# Patient Record
Sex: Male | Born: 1980 | Race: White | Hispanic: No | Marital: Single | Smoking: Current every day smoker
Health system: Southern US, Community
[De-identification: ages and names within clinical notes are randomized; demographics above are authoritative.]

## PROBLEM LIST (undated history)

## (undated) DIAGNOSIS — I10 Essential (primary) hypertension: Secondary | ICD-10-CM

---

## 2003-06-01 ENCOUNTER — Emergency Department (HOSPITAL_COMMUNITY): Admission: AC | Admit: 2003-06-01 | Discharge: 2003-06-01 | Payer: Self-pay

## 2003-06-04 ENCOUNTER — Ambulatory Visit (HOSPITAL_COMMUNITY): Admission: RE | Admit: 2003-06-04 | Discharge: 2003-06-04 | Payer: Self-pay | Admitting: Orthopedic Surgery

## 2003-06-08 ENCOUNTER — Encounter: Admission: RE | Admit: 2003-06-08 | Discharge: 2003-09-06 | Payer: Self-pay | Admitting: Orthopedic Surgery

## 2005-05-06 ENCOUNTER — Emergency Department (HOSPITAL_COMMUNITY): Admission: AD | Admit: 2005-05-06 | Discharge: 2005-05-06 | Payer: Self-pay | Admitting: Emergency Medicine
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
## 2022-03-16 DIAGNOSIS — F10939 Alcohol use, unspecified with withdrawal, unspecified: Secondary | ICD-10-CM | POA: Insufficient documentation

## 2022-03-16 DIAGNOSIS — Z720 Tobacco use: Secondary | ICD-10-CM | POA: Diagnosis present

## 2022-03-16 DIAGNOSIS — I159 Secondary hypertension, unspecified: Secondary | ICD-10-CM | POA: Insufficient documentation

## 2022-03-16 DIAGNOSIS — R112 Nausea with vomiting, unspecified: Secondary | ICD-10-CM | POA: Insufficient documentation

## 2022-03-16 DIAGNOSIS — K852 Alcohol induced acute pancreatitis without necrosis or infection: Secondary | ICD-10-CM | POA: Insufficient documentation

## 2022-03-16 DIAGNOSIS — K219 Gastro-esophageal reflux disease without esophagitis: Secondary | ICD-10-CM | POA: Diagnosis present

## 2022-03-16 DIAGNOSIS — F419 Anxiety disorder, unspecified: Secondary | ICD-10-CM | POA: Diagnosis present

## 2022-03-16 DIAGNOSIS — F1010 Alcohol abuse, uncomplicated: Secondary | ICD-10-CM | POA: Diagnosis present

## 2022-05-07 ENCOUNTER — Ambulatory Visit (HOSPITAL_COMMUNITY): Payer: Medicaid Other | Admitting: Mental Health

## 2022-05-07 ENCOUNTER — Telehealth (HOSPITAL_COMMUNITY): Payer: Self-pay | Admitting: Mental Health

## 2022-05-07 NOTE — Telephone Encounter (Signed)
Therapist sent link for tele-therapy session. No response after 10 minutes. Therapist contacted number on file with message ' number can not be completed as dialed.'  NS

## 2023-05-21 NOTE — Telephone Encounter (Signed)
 Per Dr. Debora, called pt and updated that K si 2.4.  Recommend pt present to ED for K repletion and monitoring given risk of cardiac arrhythmia.  Pt expressed understanding and in agreement with plan.

## 2023-05-23 DIAGNOSIS — K863 Pseudocyst of pancreas: Secondary | ICD-10-CM | POA: Insufficient documentation

## 2023-07-18 ENCOUNTER — Encounter (HOSPITAL_COMMUNITY): Payer: Self-pay

## 2023-07-18 ENCOUNTER — Emergency Department (HOSPITAL_COMMUNITY)

## 2023-07-18 ENCOUNTER — Observation Stay (HOSPITAL_COMMUNITY)
Admission: EM | Admit: 2023-07-18 | Discharge: 2023-07-19 | Disposition: A | Discharge status: Home / Self Care | Attending: Internal Medicine | Admitting: Internal Medicine

## 2023-07-18 ENCOUNTER — Other Ambulatory Visit: Payer: Self-pay

## 2023-07-18 DIAGNOSIS — K7010 Alcoholic hepatitis without ascites: Secondary | ICD-10-CM | POA: Diagnosis not present

## 2023-07-18 DIAGNOSIS — R519 Headache, unspecified: Secondary | ICD-10-CM | POA: Insufficient documentation

## 2023-07-18 DIAGNOSIS — K219 Gastro-esophageal reflux disease without esophagitis: Secondary | ICD-10-CM | POA: Diagnosis present

## 2023-07-18 DIAGNOSIS — I10 Essential (primary) hypertension: Secondary | ICD-10-CM | POA: Diagnosis not present

## 2023-07-18 DIAGNOSIS — F1010 Alcohol abuse, uncomplicated: Secondary | ICD-10-CM | POA: Diagnosis present

## 2023-07-18 DIAGNOSIS — E876 Hypokalemia: Secondary | ICD-10-CM | POA: Diagnosis not present

## 2023-07-18 DIAGNOSIS — M62838 Other muscle spasm: Secondary | ICD-10-CM | POA: Diagnosis present

## 2023-07-18 DIAGNOSIS — E871 Hypo-osmolality and hyponatremia: Secondary | ICD-10-CM

## 2023-07-18 DIAGNOSIS — Z720 Tobacco use: Secondary | ICD-10-CM | POA: Diagnosis not present

## 2023-07-18 DIAGNOSIS — F419 Anxiety disorder, unspecified: Secondary | ICD-10-CM | POA: Diagnosis present

## 2023-07-18 HISTORY — DX: Essential (primary) hypertension: I10

## 2023-07-18 LAB — BASIC METABOLIC PANEL WITH GFR
Anion gap: 15 (ref 5–15)
Anion gap: 10 (ref 5–15)
BUN: 17 mg/dL (ref 6–20)
BUN: 13 mg/dL (ref 6–20)
CO2: 22 mmol/L (ref 22–32)
CO2: 25 mmol/L (ref 22–32)
Calcium: 7 mg/dL — ABNORMAL LOW (ref 8.9–10.3)
Calcium: 7.2 mg/dL — ABNORMAL LOW (ref 8.9–10.3)
Chloride: 97 mmol/L — ABNORMAL LOW (ref 98–111)
Chloride: 99 mmol/L (ref 98–111)
Creatinine, Ser: 0.79 mg/dL (ref 0.61–1.24)
Creatinine, Ser: 0.91 mg/dL (ref 0.61–1.24)
GFR, Estimated: >60 mL/min (ref >60)
GFR, Estimated: >60 mL/min (ref >60)
Glucose, Bld: 121 mg/dL — ABNORMAL HIGH (ref 70–99)
Glucose, Bld: 137 mg/dL — ABNORMAL HIGH (ref 70–99)
Potassium: 2.4 mmol/L — CL (ref 3.5–5.1)
Potassium: 3.2 mmol/L — ABNORMAL LOW (ref 3.5–5.1)
Sodium: 134 mmol/L — ABNORMAL LOW (ref 135–145)
Sodium: 134 mmol/L — ABNORMAL LOW (ref 135–145)

## 2023-07-18 LAB — CBC WITH DIFFERENTIAL/PLATELET
Abs Immature Granulocytes: 0.09 K/uL — ABNORMAL HIGH (ref 0.00–0.07)
Basophils Absolute: 0.1 K/uL (ref 0.0–0.1)
Basophils Relative: 1 %
Eosinophils Absolute: 0.1 K/uL (ref 0.0–0.5)
Eosinophils Relative: 1 %
HCT: 34.9 % — ABNORMAL LOW (ref 39.0–52.0)
Hemoglobin: 12.6 g/dL — ABNORMAL LOW (ref 13.0–17.0)
Immature Granulocytes: 1 %
Lymphocytes Relative: 5 %
Lymphs Abs: 0.5 K/uL — ABNORMAL LOW (ref 0.7–4.0)
MCH: 37.8 pg — ABNORMAL HIGH (ref 26.0–34.0)
MCHC: 36.1 g/dL — ABNORMAL HIGH (ref 30.0–36.0)
MCV: 104.8 fL — ABNORMAL HIGH (ref 80.0–100.0)
Monocytes Absolute: 0.3 K/uL (ref 0.1–1.0)
Monocytes Relative: 3 %
Neutro Abs: 9 K/uL — ABNORMAL HIGH (ref 1.7–7.7)
Neutrophils Relative %: 89 %
Platelets: 123 K/uL — ABNORMAL LOW (ref 150–400)
RBC: 3.33 MIL/uL — ABNORMAL LOW (ref 4.22–5.81)
RDW: 13.9 % (ref 11.5–15.5)
WBC: 10 K/uL (ref 4.0–10.5)
nRBC: 0 % (ref 0.0–0.2)

## 2023-07-18 LAB — HEPATIC FUNCTION PANEL
ALT: 120 U/L — ABNORMAL HIGH (ref 0–44)
AST: 205 U/L — ABNORMAL HIGH (ref 15–41)
Albumin: 3.4 g/dL — ABNORMAL LOW (ref 3.5–5.0)
Alkaline Phosphatase: 109 U/L (ref 38–126)
Bilirubin, Direct: 0.7 mg/dL — ABNORMAL HIGH (ref 0.0–0.2)
Indirect Bilirubin: 1.9 mg/dL — ABNORMAL HIGH (ref 0.3–0.9)
Total Bilirubin: 2.6 mg/dL — ABNORMAL HIGH (ref 0.0–1.2)
Total Protein: 5.6 g/dL — ABNORMAL LOW (ref 6.5–8.1)

## 2023-07-18 LAB — VITAMIN D 25 HYDROXY (VIT D DEFICIENCY, FRACTURES): Vit D, 25-Hydroxy: 9.41 ng/mL — ABNORMAL LOW (ref 30–100)

## 2023-07-18 LAB — PROTIME-INR
INR: 1 (ref 0.8–1.2)
Prothrombin Time: 13.3 s (ref 11.4–15.2)

## 2023-07-18 LAB — PHOSPHORUS: Phosphorus: 1.7 mg/dL — ABNORMAL LOW (ref 2.5–4.6)

## 2023-07-18 LAB — MAGNESIUM
Magnesium: 0.7 mg/dL — CL (ref 1.7–2.4)
Magnesium: 1.3 mg/dL — ABNORMAL LOW (ref 1.7–2.4)

## 2023-07-18 LAB — ETHANOL: Alcohol, Ethyl (B): <15 mg/dL (ref <15)

## 2023-07-18 MED ORDER — MAGNESIUM SULFATE 50 % IJ SOLN: 2.0000 g | Freq: Once | INTRAMUSCULAR | Status: DC

## 2023-07-18 MED ORDER — PANTOPRAZOLE SODIUM 40 MG PO TBEC
40.0000 mg | DELAYED_RELEASE_TABLET | Freq: Two times a day (BID) | ORAL | Status: DC
Start: 2023-07-18 — End: 2023-07-19
  Administered 2023-07-18: 40 mg via ORAL
  Filled 2023-07-18: qty 1

## 2023-07-18 MED ORDER — FOLIC ACID 1 MG PO TABS
1.0000 mg | ORAL_TABLET | Freq: Every day | ORAL | Status: DC
  Administered 2023-07-18: 1 mg via ORAL
  Filled 2023-07-18: qty 1

## 2023-07-18 MED ORDER — TRAMADOL HCL 50 MG PO TABS
50.0000 mg | ORAL_TABLET | Freq: Three times a day (TID) | ORAL | Status: DC | PRN
  Administered 2023-07-18 – 2023-07-19 (×2): 50 mg via ORAL
  Filled 2023-07-18 (×2): qty 1

## 2023-07-18 MED ORDER — MAGNESIUM SULFATE 2 GM/50ML IV SOLN
2.0000 g | Freq: Once | INTRAVENOUS | Status: AC
  Administered 2023-07-18: 2 g via INTRAVENOUS
  Filled 2023-07-18: qty 50

## 2023-07-18 MED ORDER — THIAMINE MONONITRATE 100 MG PO TABS
100.0000 mg | ORAL_TABLET | Freq: Every day | ORAL | Status: DC
  Administered 2023-07-18: 100 mg via ORAL
  Filled 2023-07-18: qty 1

## 2023-07-18 MED ORDER — LOSARTAN POTASSIUM 50 MG PO TABS
50.0000 mg | ORAL_TABLET | Freq: Every day | ORAL | Status: DC
  Administered 2023-07-18: 50 mg via ORAL
  Filled 2023-07-18: qty 1

## 2023-07-18 MED ORDER — POTASSIUM CHLORIDE 20 MEQ PO PACK
60.0000 meq | PACK | Freq: Once | ORAL | Status: AC
  Administered 2023-07-18: 60 meq via ORAL
  Filled 2023-07-18: qty 3

## 2023-07-18 MED ORDER — LORAZEPAM 2 MG/ML IJ SOLN
1.0000 mg | INTRAMUSCULAR | Status: DC | PRN
  Administered 2023-07-19: 2 mg via INTRAVENOUS
  Filled 2023-07-18: qty 1

## 2023-07-18 MED ORDER — POTASSIUM CHLORIDE CRYS ER 20 MEQ PO TBCR
40.0000 meq | EXTENDED_RELEASE_TABLET | Freq: Once | ORAL | Status: AC
  Administered 2023-07-18: 40 meq via ORAL
  Filled 2023-07-18: qty 2

## 2023-07-18 MED ORDER — ESCITALOPRAM OXALATE 10 MG PO TABS: 10.0000 mg | ORAL_TABLET | Freq: Every day | ORAL | Status: DC

## 2023-07-18 MED ORDER — THIAMINE HCL 100 MG/ML IJ SOLN: 100.0000 mg | Freq: Every day | INTRAMUSCULAR | Status: DC

## 2023-07-18 MED ORDER — LORAZEPAM 1 MG PO TABS: 0.0000 mg | ORAL_TABLET | Freq: Two times a day (BID) | ORAL | Status: DC

## 2023-07-18 MED ORDER — LORAZEPAM 1 MG PO TABS
1.0000 mg | ORAL_TABLET | ORAL | Status: DC | PRN
  Administered 2023-07-19: 1 mg via ORAL
  Filled 2023-07-18: qty 1

## 2023-07-18 MED ORDER — NICOTINE 14 MG/24HR TD PT24
14.0000 mg | MEDICATED_PATCH | Freq: Every day | TRANSDERMAL | Status: DC
  Administered 2023-07-18: 14 mg via TRANSDERMAL
  Filled 2023-07-18: qty 1

## 2023-07-18 MED ORDER — POTASSIUM CHLORIDE 10 MEQ/100ML IV SOLN
10.0000 meq | INTRAVENOUS | Status: AC
  Administered 2023-07-18 (×3): 10 meq via INTRAVENOUS
  Filled 2023-07-18 (×3): qty 100

## 2023-07-18 MED ORDER — ONDANSETRON HCL 4 MG/2ML IJ SOLN: 4.0000 mg | Freq: Four times a day (QID) | INTRAMUSCULAR | Status: DC | PRN

## 2023-07-18 MED ORDER — K PHOS MONO-SOD PHOS DI & MONO 155-852-130 MG PO TABS
500.0000 mg | ORAL_TABLET | Freq: Four times a day (QID) | ORAL | Status: DC
  Administered 2023-07-18 (×3): 500 mg via ORAL
  Filled 2023-07-18 (×4): qty 2

## 2023-07-18 MED ORDER — SODIUM CHLORIDE 0.9 % IV BOLUS
1000.0000 mL | Freq: Once | INTRAVENOUS | Status: AC
  Administered 2023-07-18: 1000 mL via INTRAVENOUS

## 2023-07-18 MED ORDER — ADULT MULTIVITAMIN W/MINERALS CH
1.0000 | ORAL_TABLET | Freq: Every day | ORAL | Status: DC
  Administered 2023-07-18: 1 via ORAL
  Filled 2023-07-18: qty 1

## 2023-07-18 MED ORDER — ONDANSETRON HCL 4 MG PO TABS: 4.0000 mg | ORAL_TABLET | Freq: Four times a day (QID) | ORAL | Status: DC | PRN

## 2023-07-18 MED ORDER — LORAZEPAM 1 MG PO TABS
0.0000 mg | ORAL_TABLET | Freq: Four times a day (QID) | ORAL | Status: DC
  Administered 2023-07-18: 1 mg via ORAL
  Filled 2023-07-18: qty 2

## 2023-07-18 MED ORDER — MAGNESIUM SULFATE 2 GM/50ML IV SOLN
2.0000 g | INTRAVENOUS | Status: AC
  Administered 2023-07-18: 2 g via INTRAVENOUS
  Filled 2023-07-18: qty 50

## 2023-07-18 MED ORDER — MELATONIN 5 MG PO TABS
5.0000 mg | ORAL_TABLET | Freq: Every evening | ORAL | Status: DC | PRN
  Administered 2023-07-18: 5 mg via ORAL
  Filled 2023-07-18: qty 1

## 2023-07-18 MED ORDER — CALCIUM GLUCONATE-NACL 1-0.675 GM/50ML-% IV SOLN
1.0000 g | Freq: Once | INTRAVENOUS | Status: AC
  Administered 2023-07-18: 1000 mg via INTRAVENOUS
  Filled 2023-07-18: qty 50

## 2023-07-18 NOTE — ED Triage Notes (Signed)
 Pt BIBA from work for muscle spasms/contractions. Was working outside, felt dizzy and nauseous, went inside to cool off and then whole body started contracting. 2.5mg  versed twice by ems, legs and left arm mostly relaxed. 18ga LF. 1000cc NS  118/82 Hr 78 Cbg 100 98% RA

## 2023-07-18 NOTE — H&P (Signed)
 History and Physical    Patient: Craig Houston FMW:[id number] DOB: 04/25/1980 DOA: 07/18/2023 DOS: the patient was seen and examined on 07/18/2023 PCP: Patient, No Pcp Per  Patient coming from: Home  Chief Complaint:  Chief Complaint  Patient presents with   Spasms   HPI: Craig Houston is a 43 y.o. male with medical history significant of alcohol abuse, alcohol withdrawal syndrome, alcoholic pancreatitis, pancreatic pseudocyst, anxiety, depression, GERD, hypertension, tobacco abuse who was brought to the emergency department due to severe muscle spasms.  He still drinks alcohol every night. He denied fever, chills, rhinorrhea, sore throat, wheezing or hemoptysis.  No chest pain, palpitations, diaphoresis, PND, orthopnea or pitting edema of the lower extremities.  Occasional epigastric abdominal pain and diarrhea, but no nausea, emesis, constipation, melena or hematochezia.  No flank pain, dysuria, frequency or hematuria.  No polyuria, polydipsia, polyphagia or blurred vision.   Lab work: CBC showed white count 10.0, mono 12.6 g/dL MCV 895.1 fL and platelets are 123.  Alcohol less than 15, magnesium  0.7 and possible 1.7 mg/dL.  BMP showed sodium 134, potassium 2.4, chloride 97 and CO2 23 mmol/L.  Normal anion gap.  Glucose 121 and corrected calcium  7.6 mg/dL.  Renal function was normal.  LFTs showed a total protein of 5.6 and albumin 3.4 g/dL.  AST 205, ALT 120 and alkaline phosphatase 109 units/L.  Total bilirubin is 2.6 and direct bilirubin 0.7 mg/dL.  Imaging: CT head without contrast with no acute intracranial process.   ED course: Initial vital signs were temperature 98.9 F, pulse 84, respiration 13, BP 107/96 mmHg O2 sat 99% on room air.  The patient received magnesium  sulfate 2 g IVPB, calcium  gluconate 1 g IVPB, KCl 60 mEq p.o., KCl 10 mEq IVPB x 2.  And 1000 mL of NS bolus.  Review of Systems: As mentioned in the history of present illness. All other systems reviewed and are  negative. Past Medical History:  Diagnosis Date   Acute alcoholic pancreatitis 03/16/2022   Alcohol abuse 03/16/2022   Alcohol withdrawal syndrome (HCC) 03/16/2022   Anxiety and depression 03/16/2022   Gastroesophageal reflux disease without esophagitis 03/16/2022   Hypertension    Pancreatic pseudocyst 05/23/2023   Secondary hypertension 03/16/2022   Tobacco abuse 03/16/2022   History reviewed. No pertinent surgical history. Social History:  reports that he has been smoking cigarettes. He has never used smokeless tobacco. He reports current alcohol use. He reports that he does not use drugs.  Not on File  Family History  Problem Relation Age of Onset   Other Neg Hx     Prior to Admission medications   Not on File    Physical Exam: Vitals:   07/18/23 0855 07/18/23 0856 07/18/23 0859  BP: (!) 107/96    Pulse: 78    Resp: 13    Temp:  98.9 F (37.2 C)   TempSrc:  Oral   SpO2: 99%    Weight:   65.8 kg  Height:   5' 11 (1.803 m)   Physical Exam Vitals reviewed.  Constitutional:      General: He is awake. He is not in acute distress.    Appearance: He is normal weight. He is ill-appearing.  HENT:     Head: Normocephalic.     Nose: No rhinorrhea.     Mouth/Throat:     Mouth: Mucous membranes are moist.  Eyes:     General: Scleral icterus present.     Pupils: Pupils are equal, round,  and reactive to light.  Neck:     Vascular: No JVD.  Cardiovascular:     Rate and Rhythm: Normal rate and regular rhythm.     Heart sounds: S1 normal and S2 normal.  Pulmonary:     Effort: Pulmonary effort is normal.     Breath sounds: No wheezing, rhonchi or rales.  Abdominal:     General: Bowel sounds are normal. There is no distension.     Palpations: Abdomen is soft.     Tenderness: There is no abdominal tenderness.  Musculoskeletal:     Cervical back: Neck supple.     Right lower leg: No edema.     Left lower leg: No edema.  Skin:    General: Skin is warm and dry.   Neurological:     General: No focal deficit present.     Mental Status: He is alert and oriented to person, place, and time.  Psychiatric:        Mood and Affect: Mood normal.        Behavior: Behavior normal. Behavior is cooperative.     Data Reviewed:  Results are pending, will review when available. EKG: Vent. rate 83 BPM PR interval 158 ms QRS duration 99 ms QT/QTcB 398/468 ms P-R-T axes 40 67 33 Sinus rhythm Borderline T abnormalities, anterior leadsc  Assessment and Plan: Principal Problem:   Alcohol abuse Leading to:   Alcoholic hepatitis And   Hypomagnesemia Observation/telemetry. CIWA protocol with lorazepam . Magnesium  sulfate supplementation. Folate, MVI and thiamine . Consult TOC team. Follow-up LFTs. Alcohol cessation advised.  Active Problems:   Hypokalemia Replaced parenterally and orally. Magnesium  was supplemented.    Hypocalcemia Replacing. Follow calcium  and albumin level.    Hypophosphatemia Replacing. Follow-up phosphorus level.    Hypertension  Continue losartan  50 mg p.o. daily.    Anxiety and depression Continue escitalopram  10 mg p.o. daily.    Gastroesophageal reflux disease without esophagitis Continue pantoprazole  40 mg p.o. daily.    Tobacco abuse Tobacco cessation advised. Nicotine  replacement therapy ordered.     Advance Care Planning:   Code Status: Full Code   Consults:   Family Communication:   Severity of Illness: The appropriate patient status for this patient is INPATIENT. Inpatient status is judged to be reasonable and necessary in order to provide the required intensity of service to ensure the patient's safety. The patient's presenting symptoms, physical exam findings, and initial radiographic and laboratory data in the context of their chronic comorbidities is felt to place them at high risk for further clinical deterioration. Furthermore, it is not anticipated that the patient will be medically stable for  discharge from the hospital within 2 midnights of admission.   * I certify that at the point of admission it is my clinical judgment that the patient will require inpatient hospital care spanning beyond 2 midnights from the point of admission due to high intensity of service, high risk for further deterioration and high frequency of surveillance required.*  Author: Alm Dorn Castor, MD 07/18/2023 12:42 PM  For on call review [email or web address].   This document was prepared using Dragon voice recognition software and may contain some unintended transcription errors.

## 2023-07-18 NOTE — Plan of Care (Signed)

## 2023-07-18 NOTE — ED Provider Notes (Signed)
 [facility] AT [facility] Provider Note   CSN: [id number] Arrival date & time: 07/18/23  9153     Patient presents with: Spasms   Craig Houston is a 43 y.o. male with a history of hypertension,  presents with concern for muscle cramping that started earlier today at work.  States this involved both arms and the legs, and then subsided after about half hour.  He reports some associated pain with the cramping.  Denies any loss of consciousness or full body convulsions.  States he works at [employer] where it is very hot, and is unsure if he may be dehydrated. Denies any fever or chills.   He does report drinking about 2-3 beers nightly.  Denies any history of withdrawals.    HPI     Prior to Admission medications   Not on File    Allergies: Patient has no allergy information on record.    Review of Systems  Constitutional:  Negative for chills and fever.    Updated Vital Signs BP (!) 107/96   Pulse 78   Temp 98.9 F (37.2 C) (Oral)   Resp 13   Ht 5' 11 (1.803 m)   Wt 65.8 kg   SpO2 99%   BMI 20.22 kg/m   Physical Exam Vitals and nursing note reviewed.  Constitutional:      General: He is not in acute distress.    Appearance: He is well-developed.     Comments: No tremulousness  HENT:     Head: Normocephalic and atraumatic.     Mouth/Throat:     Mouth: Mucous membranes are dry.  Eyes:     Extraocular Movements: Extraocular movements intact.     Conjunctiva/sclera: Conjunctivae normal.     Pupils: Pupils are equal, round, and reactive to light.  Cardiovascular:     Rate and Rhythm: Normal rate and regular rhythm.     Heart sounds: No murmur heard. Pulmonary:     Effort: Pulmonary effort is normal. No respiratory distress.     Breath sounds: Normal breath sounds.  Abdominal:     Palpations: Abdomen is soft.     Tenderness: There is no abdominal tenderness.  Musculoskeletal:        General: No swelling.     Cervical  back: Neck supple.     Comments: Hands bilaterally with fingers curled in, patient reports difficulty to extend them due to cramping  Skin:    General: Skin is warm and dry.     Capillary Refill: Capillary refill takes less than 2 seconds.  Neurological:     General: No focal deficit present.     Mental Status: He is alert.     Comments: Mental status: Alert and oriented to self, place, and month  Speech: Answers questions appropriately  Cranial Nerves: III, IV, VI: EOM intact, Pupils equal round and reactive, no gaze preference or deviation, no nystagmus. V: normal sensation in V1, V2, and V3 segments bilaterally VII: smiles, puffs cheeks, raises eyebrows, and closes eyes without asymmetry.  VIII: normal hearing to speech IX, X: normal palatal elevation, no uvular deviation XI: 5/5 head turn and 5/5 shoulder shrug bilaterally XII: midline tongue protrusion  Motor: 5/5 strength with resisted elbow flexion and extension bilaterally, ankle plantarflexion and dorsiflexion  Sensory: Intact sensation in upper and lower extremity bilaterally     Psychiatric:        Mood and Affect: Mood normal.     (all labs ordered  are listed, but only abnormal results are displayed) Labs Reviewed  CBC WITH DIFFERENTIAL/PLATELET - Abnormal; Notable for the following components:      Result Value   RBC 3.33 (*)    Hemoglobin 12.6 (*)    HCT 34.9 (*)    MCV 104.8 (*)    MCH 37.8 (*)    MCHC 36.1 (*)    Platelets 123 (*)    Neutro Abs 9.0 (*)    Lymphs Abs 0.5 (*)    Abs Immature Granulocytes 0.09 (*)    All other components within normal limits  BASIC METABOLIC PANEL WITH GFR - Abnormal; Notable for the following components:   Sodium 134 (*)    Potassium 2.4 (*)    Chloride 97 (*)    Glucose, Bld 121 (*)    Calcium  7.0 (*)    All other components within normal limits  MAGNESIUM  - Abnormal; Notable for the following components:   Magnesium  0.7 (*)    All other components within normal  limits  HEPATIC FUNCTION PANEL  PHOSPHORUS    EKG: None  Radiology: CT Head Wo Contrast Result Date: 07/18/2023 CLINICAL DATA:  New onset seizure, dizzy and nausea EXAM: CT HEAD WITHOUT CONTRAST TECHNIQUE: Contiguous axial images were obtained from the base of the skull through the vertex without intravenous contrast. RADIATION DOSE REDUCTION: This exam was performed according to the departmental dose-optimization program which includes automated exposure control, adjustment of the mA and/or kV according to patient size and/or use of iterative reconstruction technique. COMPARISON:  06/01/2003 FINDINGS: Brain: No acute infarct or hemorrhage. Lateral ventricles and midline structures appear unremarkable. No acute extra-axial fluid collections. No mass effect. Vascular: No hyperdense vessel or unexpected calcification. Skull: Normal. Negative for fracture or focal lesion. Sinuses/Orbits: Chronic mucosal thickening within the sphenoid sinus. Remaining paranasal sinuses are clear. Other: None. IMPRESSION: 1. No acute intracranial process. Electronically Signed   By: Ozell Daring M.D.   On: 07/18/2023 09:36     .Critical Care  Performed by: Veta Palma, PA-C Authorized by: Veta Palma, PA-C   Critical care provider statement:    Critical care time (minutes):  32   Critical care was necessary to treat or prevent imminent or life-threatening deterioration of the following conditions:  Metabolic crisis   Critical care was time spent personally by me on the following activities:  Development of treatment plan with patient or surrogate, discussions with consultants, evaluation of patient's response to treatment, examination of patient, ordering and review of laboratory studies, ordering and performing treatments and interventions, pulse oximetry, re-evaluation of patient's condition, review of old charts, obtaining history from patient or surrogate and ordering and review of radiographic  studies   Care discussed with: admitting provider   Comments:     Severe symptomatic hypokalemia, hypocalcemia, hypomagnesemia, hyponatremia    Medications Ordered in the ED  potassium chloride  10 mEq in 100 mL IVPB (10 mEq Intravenous New Bag/Given 07/18/23 1031)  magnesium  sulfate IVPB 2 g 50 mL (has no administration in time range)  nicotine  (NICODERM CQ  - dosed in mg/24 hours) patch 14 mg (has no administration in time range)  sodium chloride  0.9 % bolus 1,000 mL (1,000 mLs Intravenous New Bag/Given 07/18/23 1026)  potassium chloride  (KLOR-CON ) packet 60 mEq (60 mEq Oral Given 07/18/23 1017)  magnesium  sulfate IVPB 2 g 50 mL (2 g Intravenous New Bag/Given 07/18/23 1030)  calcium  gluconate 1 g/ 50 mL sodium chloride  IVPB (1,000 mg Intravenous New Bag/Given 07/18/23 1029)  Medical Decision Making Amount and/or Complexity of Data Reviewed Labs: ordered. Radiology: ordered.  Risk Prescription drug management. Decision regarding hospitalization.     Differential diagnosis includes but is not limited to electrolyte abnormality, dehydration, seizure  ED Course:  Upon initial evaluation, patient is well appearing, no acute distress. Fingers curled on the hands bilaterally which patient reports is from the muscle cramping. No muscle cramping elsewhere currently. Normal neurological exam. Does appear slightly dry. Given report of body shaking, will obtain head CT as part of potential seizure workup. However, think seizure less likely cause of patient symptoms given no full body convulsions or LOC. Questioning heat stroke/dehydration/electrolyte abnormality at this time.   Labs Ordered: I Ordered, and personally interpreted labs.  The pertinent results include:   CBC without leukocytosis.  Hemoglobin low at 12.6 BMP with hypokalemia at 2.4, hyponatremia at 134, hypocalcemia at 7.0 Magnesium  low at 0.7  Imaging Studies ordered: I ordered imaging  studies including CT head  I independently visualized the imaging with scope of interpretation limited to determining acute life threatening conditions related to emergency care. Imaging showed  No acute abnormality I agree with the radiologist interpretation   Cardiac Monitoring: / EKG: The patient was maintained on a cardiac monitor.  I personally viewed and interpreted the cardiac monitored which showed an underlying rhythm of: Normal sinus rhythm     Consultations Obtained: I requested consultation with the hospitalist Dr. Celinda,  and discussed lab and imaging findings as well as pertinent plan - they recommend: Admission for further management of electrolyte abnormalities  Medications Given: NS bolus 60 mEq KCl oral 10 mEq KCl IV x3 2g magnesium  sulfate IV 1g Calcium  gluconate   Upon re-evaluation, patient reports he is feeling better. Cramping in his hands has improved, and he is able to extend out his fingers in the hands fully now.  Low concern for new onset seizure given electrolyte abnormalities would explain muscle cramps today and no full body convulsion or loss of consciousness. Cardiac monitoring without any signs of arrhythmia from his electrolyte abnormalities, will continue to monitor. This seems likely secondary to chronic alcohol use as well as dehydration.     Impression: Hypokalemia Hypocalcemia Hypomagnesemia Hyponatremia  Disposition:  Admission with hospitalist Dr. Celinda.    This chart was dictated using voice recognition software, Dragon. Despite the best efforts of this provider to proofread and correct errors, errors may still occur which can change documentation meaning.       Final diagnoses:  Hypokalemia  Hypomagnesemia  Hyponatremia  Hypocalcemia    ED Discharge Orders     None          Veta Palma, PA-C 07/18/23 1148    Neysa Caron PARAS, [place] 07/18/23 1507

## 2023-07-18 NOTE — TOC Initial Note (Signed)
 Transition of Care [facility]) - Initial/Assessment Note    Patient Details  Name: Craig Houston MRN: [id number] Date of Birth: [birth year]  [facility]) CM/SW Contact:    Sonda Manuella Quill, RN Phone Number: 07/18/2023, 4:03 PM  Clinical Narrative:                 [facility] consult for SA counseling/education; also no PCP listed; spoke w/ pt in room; pt says he lives at home; he plans to return at d/c; pt identified POC sister Craig Houston [phone]); he will arrange his own transportation; pt verified insurance; he says his PCP is Dr Rosalva at [facility]; pt denied SDOH risks; he does not have DME, HH services, or home oxygen; pt declined resources for SA counseling/education; no TOC needs; TOC is signing off; please place consult if needed.  Expected Discharge Plan: Home/Self Care Barriers to Discharge: Continued Medical Work up   Patient Goals and CMS Choice Patient states their goals for this hospitalization and ongoing recovery are:: home          Expected Discharge Plan and Services   Discharge Planning Services: CM Consult   Living arrangements for the past 2 months: Single Family Home                                      Prior Living Arrangements/Services Living arrangements for the past 2 months: Single Family Home Lives with:: Self Patient language and need for interpreter reviewed:: Yes Do you feel safe going back to the place where you live?: Yes      Need for Family Participation in Patient Care: Yes (Comment) Care giver support system in place?: Yes (comment) Current home services:  (n/a) Criminal Activity/Legal Involvement Pertinent to Current Situation/Hospitalization: No - Comment as needed  Activities of Daily Living      Permission Sought/Granted   Permission granted to share information with : Yes, Verbal Permission Granted  Share Information with NAME: Case Manager     Permission granted to share info w Relationship:  Craig Houston (sister) [phone]     Emotional Assessment Appearance:: Appears stated age Attitude/Demeanor/Rapport: Gracious Affect (typically observed): Accepting Orientation: : Oriented to Self, Oriented to Place, Oriented to  Time, Oriented to Situation Alcohol / Substance Use: Not Applicable Psych Involvement: No (comment)  Admission diagnosis:  Hypocalcemia [E83.51] Hypokalemia [E87.6] Hypomagnesemia [E83.42] Hyponatremia [E87.1] Patient Active Problem List   Diagnosis Date Noted   Hypomagnesemia 07/18/2023   Pancreatic pseudocyst 05/23/2023   Anxiety and depression 03/16/2022   Alcohol abuse 03/16/2022   Acute alcoholic pancreatitis 03/16/2022   Alcohol withdrawal syndrome (HCC) 03/16/2022   Gastroesophageal reflux disease without esophagitis 03/16/2022   Secondary hypertension 03/16/2022   Tobacco abuse 03/16/2022   N&V (nausea and vomiting) 03/16/2022   PCP:  Patient, No Pcp Per Pharmacy:   [id number] - [place], [place] - [address] AT [facility] [address] [place] [address] Phone: [phone] Fax: [phone]     Social Drivers of Health (SDOH) Social History: SDOH Screenings   Food Insecurity: No Food Insecurity (07/18/2023)  Housing: Low Risk  (07/18/2023)  Transportation Needs: No Transportation Needs (07/18/2023)  Utilities: Not At Risk (07/18/2023)  Financial Resource Strain: Low Risk  (03/16/2022)   Received from [facility]  Social Connections: Unknown (05/23/2021)   Received from [facility]  Stress: Stress Concern Present (03/16/2022)   Received from [facility]  Tobacco Use: High Risk (07/18/2023)   SDOH Interventions: Food Insecurity Interventions: Intervention Not Indicated, Inpatient TOC Housing Interventions: Intervention Not Indicated, Inpatient TOC Transportation Interventions: Intervention Not Indicated, Inpatient TOC Utilities Interventions: Intervention Not Indicated, Inpatient  TOC   Readmission Risk Interventions     No data to display

## 2023-07-19 LAB — COMPREHENSIVE METABOLIC PANEL WITH GFR
ALT: 89 U/L — ABNORMAL HIGH (ref 0–44)
AST: 131 U/L — ABNORMAL HIGH (ref 15–41)
Albumin: 3.1 g/dL — ABNORMAL LOW (ref 3.5–5.0)
Alkaline Phosphatase: 109 U/L (ref 38–126)
Anion gap: 8 (ref 5–15)
BUN: 9 mg/dL (ref 6–20)
CO2: 26 mmol/L (ref 22–32)
Calcium: 7.1 mg/dL — ABNORMAL LOW (ref 8.9–10.3)
Chloride: 102 mmol/L (ref 98–111)
Creatinine, Ser: 0.6 mg/dL — ABNORMAL LOW (ref 0.61–1.24)
GFR, Estimated: >60 mL/min (ref >60)
Glucose, Bld: 107 mg/dL — ABNORMAL HIGH (ref 70–99)
Potassium: 3.3 mmol/L — ABNORMAL LOW (ref 3.5–5.1)
Sodium: 136 mmol/L (ref 135–145)
Total Bilirubin: 1.2 mg/dL (ref 0.0–1.2)
Total Protein: 5.3 g/dL — ABNORMAL LOW (ref 6.5–8.1)

## 2023-07-19 LAB — CBC
HCT: 32.2 % — ABNORMAL LOW (ref 39.0–52.0)
Hemoglobin: 11.3 g/dL — ABNORMAL LOW (ref 13.0–17.0)
MCH: 37.4 pg — ABNORMAL HIGH (ref 26.0–34.0)
MCHC: 35.1 g/dL (ref 30.0–36.0)
MCV: 106.6 fL — ABNORMAL HIGH (ref 80.0–100.0)
Platelets: 100 K/uL — ABNORMAL LOW (ref 150–400)
RBC: 3.02 MIL/uL — ABNORMAL LOW (ref 4.22–5.81)
RDW: 14 % (ref 11.5–15.5)
WBC: 8.2 K/uL (ref 4.0–10.5)
nRBC: 0 % (ref 0.0–0.2)

## 2023-07-19 LAB — MAGNESIUM: Magnesium: 1.8 mg/dL (ref 1.7–2.4)

## 2023-07-19 LAB — HIV ANTIBODY (ROUTINE TESTING W REFLEX): HIV Screen 4th Generation wRfx: NONREACTIVE

## 2023-07-19 LAB — PHOSPHORUS: Phosphorus: 2.8 mg/dL (ref 2.5–4.6)

## 2023-07-19 MED ORDER — FOLIC ACID 1 MG PO TABS: 1.0000 mg | ORAL_TABLET | Freq: Every day | ORAL | 0 refills | Status: AC

## 2023-07-19 MED ORDER — POTASSIUM CHLORIDE CRYS ER 20 MEQ PO TBCR: 20.0000 meq | EXTENDED_RELEASE_TABLET | Freq: Every day | ORAL | 0 refills | Status: AC

## 2023-07-19 MED ORDER — MAGNESIUM OXIDE -MG SUPPLEMENT 400 (240 MG) MG PO TABS
400.0000 mg | ORAL_TABLET | Freq: Two times a day (BID) | ORAL | 0 refills | Status: AC
Start: 2023-07-19 — End: ?

## 2023-07-19 MED ORDER — POTASSIUM CHLORIDE CRYS ER 20 MEQ PO TBCR: 40.0000 meq | EXTENDED_RELEASE_TABLET | Freq: Once | ORAL | Status: DC

## 2023-07-19 MED ORDER — ADULT MULTIVITAMIN W/MINERALS CH
1.0000 | ORAL_TABLET | Freq: Every day | ORAL | Status: AC
Start: 2023-07-19 — End: ?

## 2023-07-19 MED ORDER — VITAMIN B-1 100 MG PO TABS: 100.0000 mg | ORAL_TABLET | Freq: Every day | ORAL | 0 refills | Status: AC

## 2023-07-19 NOTE — Plan of Care (Signed)
  Problem: Education: Goal: Knowledge of General Education information will improve Description: Including pain rating scale, medication(s)/side effects and non-pharmacologic comfort measures Outcome: Progressing   Problem: Health Behavior/Discharge Planning: Goal: Ability to manage health-related needs will improve Outcome: Progressing   Problem: Clinical Measurements: Goal: Diagnostic test results will improve Outcome: Progressing   Problem: Activity: Goal: Risk for activity intolerance will decrease Outcome: Progressing   Problem: Pain Managment: Goal: General experience of comfort will improve and/or be controlled Outcome: Progressing

## 2023-07-19 NOTE — Discharge Summary (Signed)
 Physician Discharge Summary  Craig Houston FMW:[id number] DOB: 02/27/1980 DOA: 07/18/2023  PCP: Patient, No Pcp Per  Admit date: 07/18/2023 Discharge date: 07/19/2023  Admitted From: Home Disposition: Home  Recommendations for Outpatient Follow-up:  Follow up with PCP in 1 week with repeat CBC/BMP/magnesium  Follow up in ED if symptoms worsen or new appear   Home Health: No Equipment/Devices: None  Discharge Condition: Stable CODE STATUS: Full Diet recommendation: Heart healthy  Brief/Interim Summary: 43 y.o. male with medical history significant of alcohol abuse, alcohol withdrawal syndrome, alcoholic pancreatitis, pancreatic pseudocyst, anxiety, depression, GERD, hypertension, tobacco abuse presented with muscle spasms.  He was found to have magnesium  of 0.7 which was aggressively replaced.  Subsequently, his magnesium  has normalized.  He feels better and feels okay to go home today.  He will be discharged home today.  Outpatient follow-up with PCP  Discharge Diagnoses:   Hypomagnesemia: Improved hypokalemia Hypophosphatemia: Improved Hypocalcemia - Presented with muscle spasms and was found to have electrolyte abnormalities including severe hypomagnesemia.  Magnesium  level has normalized after aggressive repletion.  Will continue oral magnesium  supplementation on discharge.  Potassium is slightly on the lower side: Will replace prior to discharge and continue supplementation on discharge. - Outpatient follow-up of BMP  Alcohol abuse Alcohol hepatitis - Counseled regarding abstinence from alcohol.  No signs of withdrawal.  LFTs improving.  Outpatient follow-up of LFTs - Continue thiamine , multivitamin, folic acid .  Hypertension - Continue losartan .  Outpatient follow-up  GERD - Continue PPI  Tobacco abuse -Tobacco cessation counseling provided by admitting hospitalist   Discharge Instructions  Discharge Instructions     Diet - low sodium heart healthy   Complete  by: As directed    Increase activity slowly   Complete by: As directed       Allergies as of 07/19/2023   No Known Allergies      Medication List     TAKE these medications    escitalopram  10 MG tablet Commonly known as: LEXAPRO  Take 10 mg by mouth daily.   folic acid  1 MG tablet Commonly known as: FOLVITE  Take 1 tablet (1 mg total) by mouth daily.   ibuprofen 200 MG tablet Commonly known as: ADVIL Take 200-800 mg by mouth every 6 (six) hours as needed for moderate pain (pain score 4-6).   losartan  50 MG tablet Commonly known as: COZAAR  Take 50 mg by mouth daily.   magnesium  oxide 400 (240 Mg) MG tablet Commonly known as: MAG-OX Take 1 tablet (400 mg total) by mouth 2 (two) times daily.   multivitamin with minerals Tabs tablet Take 1 tablet by mouth daily.   ondansetron  4 MG disintegrating tablet Commonly known as: ZOFRAN -ODT Take 4 mg by mouth every 8 (eight) hours as needed for nausea or vomiting.   pantoprazole  40 MG tablet Commonly known as: PROTONIX  Take 40 mg by mouth 2 (two) times daily.   potassium chloride  SA 20 MEQ tablet Commonly known as: KLOR-CON  M Take 1 tablet (20 mEq total) by mouth daily.   thiamine  100 MG tablet Commonly known as: Vitamin B-1 Take 1 tablet (100 mg total) by mouth daily.   traMADol  50 MG tablet Commonly known as: ULTRAM  Take 50 mg by mouth 3 (three) times daily as needed for moderate pain (pain score 4-6).        Follow-up Information     PCP. Schedule an appointment as soon as possible for a visit in 1 week(s).  No Known Allergies  Consultations: None   Procedures/Studies: CT Head Wo Contrast Result Date: 07/18/2023 CLINICAL DATA:  New onset seizure, dizzy and nausea EXAM: CT HEAD WITHOUT CONTRAST TECHNIQUE: Contiguous axial images were obtained from the base of the skull through the vertex without intravenous contrast. RADIATION DOSE REDUCTION: This exam was performed according to the  departmental dose-optimization program which includes automated exposure control, adjustment of the mA and/or kV according to patient size and/or use of iterative reconstruction technique. COMPARISON:  06/01/2003 FINDINGS: Brain: No acute infarct or hemorrhage. Lateral ventricles and midline structures appear unremarkable. No acute extra-axial fluid collections. No mass effect. Vascular: No hyperdense vessel or unexpected calcification. Skull: Normal. Negative for fracture or focal lesion. Sinuses/Orbits: Chronic mucosal thickening within the sphenoid sinus. Remaining paranasal sinuses are clear. Other: None. IMPRESSION: 1. No acute intracranial process. Electronically Signed   By: Ozell Daring M.D.   On: 07/18/2023 09:36      Subjective: Patient seen and examined at bedside.  Feels better and feels okay to go home today.  Denies any muscle spasms currently.  No fever or vomiting reported.  Discharge Exam: Vitals:   07/19/23 0200 07/19/23 0416  BP: 120/80 123/87  Pulse: 88 69  Resp:  16  Temp:  (!) 97.5 F (36.4 C)  SpO2:  99%    General: Pt is alert, awake, not in acute distress Cardiovascular: rate controlled, S1/S2 + Respiratory: bilateral decreased breath sounds at bases Abdominal: Soft, NT, ND, bowel sounds + Extremities: no edema, no cyanosis    The results of significant diagnostics from this hospitalization (including imaging, microbiology, ancillary and laboratory) are listed below for reference.     Microbiology: No results found for this or any previous visit (from the past 240 hours).   Labs: BNP (last 3 results) No results for input(s): BNP in the last 8760 hours. Basic Metabolic Panel: Recent Labs  Lab 07/18/23 0905 07/18/23 1442 07/19/23 0454  NA 134* 134* 136  K 2.4* 3.2* 3.3*  CL 97* 99 102  CO2 22 25 26   GLUCOSE 121* 137* 107*  BUN 17 13 9   CREATININE 0.79 0.91 0.60*  CALCIUM  7.0* 7.2* 7.1*  MG 0.7* 1.3* 1.8  PHOS 1.7*  --  2.8   Liver  Function Tests: Recent Labs  Lab 07/18/23 0905 07/19/23 0454  AST 205* 131*  ALT 120* 89*  ALKPHOS 109 109  BILITOT 2.6* 1.2  PROT 5.6* 5.3*  ALBUMIN 3.4* 3.1*   No results for input(s): LIPASE, AMYLASE in the last 168 hours. No results for input(s): AMMONIA in the last 168 hours. CBC: Recent Labs  Lab 07/18/23 0905 07/19/23 0454  WBC 10.0 8.2  NEUTROABS 9.0*  --   HGB 12.6* 11.3*  HCT 34.9* 32.2*  MCV 104.8* 106.6*  PLT 123* 100*   Cardiac Enzymes: No results for input(s): CKTOTAL, CKMB, CKMBINDEX, TROPONINI in the last 168 hours. BNP: Invalid input(s): POCBNP CBG: No results for input(s): GLUCAP in the last 168 hours. D-Dimer No results for input(s): DDIMER in the last 72 hours. Hgb A1c No results for input(s): HGBA1C in the last 72 hours. Lipid Profile No results for input(s): CHOL, HDL, LDLCALC, TRIG, CHOLHDL, LDLDIRECT in the last 72 hours. Thyroid function studies No results for input(s): TSH, T4TOTAL, T3FREE, THYROIDAB in the last 72 hours.  Invalid input(s): FREET3 Anemia work up No results for input(s): VITAMINB12, FOLATE, FERRITIN, TIBC, IRON, RETICCTPCT in the last 72 hours. Urinalysis No results found for: COLORURINE, APPEARANCEUR, LABSPEC, PHURINE,  [place], HGBUR, BILIRUBINUR, KETONESUR, PROTEINUR, UROBILINOGEN, NITRITE, LEUKOCYTESUR Sepsis Labs Recent Labs  Lab 07/18/23 0905 07/19/23 0454  WBC 10.0 8.2   Microbiology No results found for this or any previous visit (from the past 240 hours).   Time coordinating discharge: 35 minutes  SIGNED:   Sophie Mao, MD  [facility] 07/19/2023, 9:50 AM
